# Patient Record
Sex: Female | Born: 1990 | Race: Black or African American | Hispanic: No | Marital: Single | Smoking: Never smoker
Health system: Southern US, Community
[De-identification: ages and names within clinical notes are randomized; demographics above are authoritative.]

---

## 2013-08-05 ENCOUNTER — Encounter (HOSPITAL_BASED_OUTPATIENT_CLINIC_OR_DEPARTMENT_OTHER): Payer: Self-pay | Admitting: Emergency Medicine

## 2013-08-05 ENCOUNTER — Emergency Department (HOSPITAL_BASED_OUTPATIENT_CLINIC_OR_DEPARTMENT_OTHER)
Admission: EM | Admit: 2013-08-05 | Discharge: 2013-08-05 | Disposition: A | Discharge status: Home / Self Care | Payer: Managed Care, Other (non HMO) | Attending: Emergency Medicine | Admitting: Emergency Medicine

## 2013-08-05 ENCOUNTER — Emergency Department (HOSPITAL_BASED_OUTPATIENT_CLINIC_OR_DEPARTMENT_OTHER): Payer: Managed Care, Other (non HMO)

## 2013-08-05 DIAGNOSIS — S058X9A Other injuries of unspecified eye and orbit, initial encounter: Secondary | ICD-10-CM | POA: Insufficient documentation

## 2013-08-05 DIAGNOSIS — W540XXA Bitten by dog, initial encounter: Secondary | ICD-10-CM | POA: Insufficient documentation

## 2013-08-05 DIAGNOSIS — S01119A Laceration without foreign body of unspecified eyelid and periocular area, initial encounter: Secondary | ICD-10-CM | POA: Insufficient documentation

## 2013-08-05 DIAGNOSIS — IMO0002 Reserved for concepts with insufficient information to code with codable children: Secondary | ICD-10-CM

## 2013-08-05 DIAGNOSIS — S51809A Unspecified open wound of unspecified forearm, initial encounter: Secondary | ICD-10-CM | POA: Insufficient documentation

## 2013-08-05 DIAGNOSIS — Z23 Encounter for immunization: Secondary | ICD-10-CM | POA: Insufficient documentation

## 2013-08-05 DIAGNOSIS — Y9389 Activity, other specified: Secondary | ICD-10-CM | POA: Insufficient documentation

## 2013-08-05 DIAGNOSIS — S0181XA Laceration without foreign body of other part of head, initial encounter: Secondary | ICD-10-CM

## 2013-08-05 DIAGNOSIS — S01409A Unspecified open wound of unspecified cheek and temporomandibular area, initial encounter: Secondary | ICD-10-CM | POA: Insufficient documentation

## 2013-08-05 DIAGNOSIS — Z79899 Other long term (current) drug therapy: Secondary | ICD-10-CM | POA: Insufficient documentation

## 2013-08-05 DIAGNOSIS — Y92009 Unspecified place in unspecified non-institutional (private) residence as the place of occurrence of the external cause: Secondary | ICD-10-CM | POA: Insufficient documentation

## 2013-08-05 DIAGNOSIS — T148XXA Other injury of unspecified body region, initial encounter: Secondary | ICD-10-CM

## 2013-08-05 DIAGNOSIS — S0530XA Ocular laceration without prolapse or loss of intraocular tissue, unspecified eye, initial encounter: Secondary | ICD-10-CM | POA: Insufficient documentation

## 2013-08-05 MED ORDER — LIDOCAINE-EPINEPHRINE-TETRACAINE (LET) SOLUTION
3.0000 mL | Freq: Once | NASAL | Status: AC
  Administered 2013-08-05: 3 mL via TOPICAL
  Filled 2013-08-05: qty 3

## 2013-08-05 MED ORDER — AMOXICILLIN-POT CLAVULANATE 875-125 MG PO TABS: 1.0000 | ORAL_TABLET | Freq: Two times a day (BID) | ORAL | Status: AC

## 2013-08-05 MED ORDER — OXYCODONE-ACETAMINOPHEN 5-325 MG PO TABS
1.0000 | ORAL_TABLET | Freq: Once | ORAL | Status: AC
  Administered 2013-08-05: 1 via ORAL
  Filled 2013-08-05: qty 1

## 2013-08-05 MED ORDER — ONDANSETRON 8 MG PO TBDP
8.0000 mg | ORAL_TABLET | Freq: Once | ORAL | Status: AC
  Administered 2013-08-05: 8 mg via ORAL
  Filled 2013-08-05: qty 1

## 2013-08-05 MED ORDER — OXYCODONE-ACETAMINOPHEN 5-325 MG PO TABS: 1.0000 | ORAL_TABLET | ORAL | Status: AC | PRN

## 2013-08-05 MED ORDER — TETANUS-DIPHTH-ACELL PERTUSSIS 5-2.5-18.5 LF-MCG/0.5 IM SUSP
0.5000 mL | Freq: Once | INTRAMUSCULAR | Status: AC
  Administered 2013-08-05: 0.5 mL via INTRAMUSCULAR
  Filled 2013-08-05: qty 0.5

## 2013-08-05 NOTE — ED Notes (Signed)
I was unable to get new set of vitals as PA. was completing multiple sutures.

## 2013-08-05 NOTE — ED Provider Notes (Signed)
Medical screening examination/treatment/procedure(s) were conducted as a shared visit with non-physician practitioner(s) and myself.  I personally evaluated the patient during the encounter.  Multiple dog bit his right forearm with concern of underlying muscle or tendon involvement causing weakness to the patient's hand; facial injuries closed however right forearm injuries will remain open after irrigation in ED and patient can follow up with hand surgery.   Hurman HornJohn M Azlin Zilberman, MD 08/07/13 1318

## 2013-08-05 NOTE — ED Provider Notes (Signed)
CSN: [id number]     Arrival date & time 08/05/13  1539 History   First MD Initiated Contact with Patient 08/05/13 1727     Chief Complaint  Patient presents with  . Animal Bite   23 yo AA F presents with injury sustained from a dog bite at 2:30. The dog belongs to here cousin, whom she was visiting. She is familiar with the dog and has petted it many times before. She said she went inside her cousin's home, petted the dog as usual and went upstairs. The dog followed her and bit her on her R face, unprovoked. She tried to move the dog and was subsequently bit on her R fore arm.. Police Report was filed, dog up to date on shots, Pts last tetanus was 2008. No known allergies, no medications other than depo.   (Consider location/radiation/quality/duration/timing/severity/associated sxs/prior Treatment) Patient is a 23 y.o. female presenting with animal bite.  Animal Bite Associated symptoms: numbness   Associated symptoms: no fever     History reviewed. No pertinent past medical history. History reviewed. No pertinent past surgical history. History reviewed. No pertinent family history. History  Substance Use Topics  . Smoking status: Never Smoker   . Smokeless tobacco: Not on file  . Alcohol Use: Yes     Comment: weekly   OB History   Grav Para Term Preterm Abortions TAB SAB Ect Mult Living                 Review of Systems  Constitutional: Negative for fever and chills.  HENT:       No pain with mandible movement, Pt feels jaw lines up appropriately as before  Eyes: Negative for pain and redness.       No pain with ocular movement  Skin: Positive for wound.       Wound to R face/cheek and R forearm  Neurological: Positive for weakness and numbness.       Numbness and weakness of R hand/wrist  Hematological: Does not bruise/bleed easily.      Allergies  Review of patient's allergies indicates no known allergies.  Home Medications   Prior to Admission medications     Medication Sig Start Date End Date Taking? Authorizing Provider  medroxyPROGESTERone (DEPO-PROVERA) 150 MG/ML injection Inject 150 mg into the muscle every 3 (three) months.   Yes Historical Provider, MD   BP 158/102  Pulse 77  Temp(Src) 98.4 F (36.9 C) (Oral)  Resp 18  Ht 5\' 9"  (1.753 m)  Wt 174 lb (78.926 kg)  BMI 25.68 kg/m2  SpO2 99% Physical Exam  Constitutional: She is oriented to person, place, and time.  HENT:  Head:    Cardiovascular:  Pulses:      Radial pulses are 2+ on the right side, and 2+ on the left side.  Hemostasis achieved for all wounds. R hand is cool to touch but is vascularly intact  Musculoskeletal:  ROM-motor deficits noted on R wrist flexion and extension. Upon passive ROM, pt reports pain at bite site.   Neurological: She is alert and oriented to person, place, and time.   Able to flex and extend 4th and 5th finger. Unable to flex or extend 2nd and 3rd. Can flex thumb, but not extendUnable to flex or extend fingers. Minimal active Pt states she can still sense touch on her fingers but lacks motor control.  Skin:  Location of dog bite is on proximal r fore arm and resulting lesions are in circumferential  pattern with roughly 4-5 lesions anterior and 3-4 lesions posterior.. Several lacerations and puncture wounds ranging from 0.5cm to 1.5cm all of approx 0.5 to 1cm in depth.     ED Course  Procedures (including critical care time) Labs Review Labs Reviewed - No data to display  Imaging Review Dg Forearm Right  08/05/2013   CLINICAL DATA:  Multiple right forearm lacerations following a dog bite.  EXAM: RIGHT FOREARM - 2 VIEW  COMPARISON:  None.  FINDINGS: Multiple small, rounded soft tissue defects in the proximal forearm with overlying bandage material. No fracture, dislocation or radiopaque foreign body.  IMPRESSION: Areas of soft tissue injury without fracture.   Electronically Signed   By: Gordan Payment M.D.   On: 08/05/2013 19:53     EKG  Interpretation None     Multiple puncture lacerations numbed with 2% lidocaine, heavy irrigation with debridement. No sutures placed, given mechanism of injury as dog bite.  MDM   Final diagnoses:  None   Diagnosis: Animal Bite #2 Facial Lacerations #3 Multiple puncture rooms, with possible tendon deficit on R forearm  Dog bite from well-known dog, known to have been vaccinated. No rabies course administered. Discussed possible tendon deficits with Dr. Mina Marble who advises splint and office f/u. Tetanus updated. Pain managed in ED. Pot placed on Augmentin and encouraged to have close f/u      Sharlene Motts, PA-C 08/05/13 2201

## 2013-08-05 NOTE — ED Provider Notes (Signed)
LACERATION REPAIR Performed by: Elpidio AnisUPSTILL, Dlynn Ranes A Authorized by: Elpidio AnisUPSTILL, Esma Kilts A Consent: Verbal consent obtained. Risks and benefits: risks, benefits and alternatives were discussed Consent given by: patient Patient identity confirmed: provided demographic data Prepped and Draped in normal sterile fashion Wound explored  Laceration Location: Right face, over zygoma  Laceration Length: 2 cm  No Foreign Bodies seen or palpated  Anesthesia: local infiltration  Local anesthetic: lidocaine 2% w/o epinephrine  Anesthetic total: 1 ml, L.E.T. Also used  Irrigation method: syringe Amount of cleaning: standard  Skin closure: 6-0 prolene  Number of sutures: 6  Technique: running  Patient tolerance: Patient tolerated the procedure well with no immediate complications.     #2 LACERATION REPAIR Performed by: Elpidio AnisUPSTILL, Jisella Ashenfelter A Authorized by: Elpidio AnisUPSTILL, Delainey Winstanley A Consent: Verbal consent obtained. Risks and benefits: risks, benefits and alternatives were discussed Consent given by: patient Patient identity confirmed: provided demographic data Prepped and Draped in normal sterile fashion Wound explored  Laceration Location: right perioral area  Laceration Length: stellate  No Foreign Bodies seen or palpated  Anesthesia: local infiltration  Local anesthetic: lidocaine 2% w/o epinephrine  Anesthetic total: 1 ml, LET used also  Irrigation method: syringe Amount of cleaning: standard  Skin closure: 6-0 prolene  Number of sutures: 6  Technique: simple interrupted  Patient tolerance: Patient tolerated the procedure well with no immediate complications.   #3 LACERATION REPAIR Performed by: Elpidio AnisUPSTILL, Asianae Minkler A Authorized by: Elpidio AnisUPSTILL, Kearstyn Avitia A Consent: Verbal consent obtained. Risks and benefits: risks, benefits and alternatives were discussed Consent given by: patient Patient identity confirmed: provided demographic data Prepped and Draped in normal sterile fashion Wound  explored  Laceration Location: below right eye  Laceration Length: less than 1 cm  No Foreign Bodies seen or palpated  Anesthesia: local infiltration  Local anesthetic: lidocaine 2% w/o epinephrine  Anesthetic total: less than 1  ml  Irrigation method: syringe Amount of cleaning: standard  Skin closure: 6-0 prolene  Number of sutures: 1  Technique: simple interruped  Patient tolerance: Patient tolerated the procedure well with no immediate complications.   #4 LACERATION REPAIR Performed by: Elpidio AnisUPSTILL, Joss Friedel A Authorized by: Elpidio AnisUPSTILL, Gerda Yin A Consent: Verbal consent obtained. Risks and benefits: risks, benefits and alternatives were discussed Consent given by: patient Patient identity confirmed: provided demographic data Prepped and Draped in normal sterile fashion Wound explored  Laceration Location: lateral right cheek  Laceration Length: less than 1 cm  No Foreign Bodies seen or palpated  Anesthesia: local infiltration  Local anesthetic: lidocaine 2% w/o epinephrine  Anesthetic total: 1 ml  Irrigation method: syringe Amount of cleaning: standard  Skin closure: simple interrupted  Number of sutures: 2  Technique: simple interruped  Patient tolerance: Patient tolerated the procedure well with no immediate complications.   Arnoldo HookerShari A Treyvonne Tata, PA-C 08/05/13 2154

## 2013-08-05 NOTE — ED Notes (Signed)
Pt's facial wounds now sutured and clean. Sugar tong cast in place.

## 2013-08-05 NOTE — ED Notes (Signed)
Patient was bitten by her friends dog this afternoon. Wounds on her face and right arm. Patient has a large wound on her arm, continuing to bleed, as well as puncture teeth wounds. She has three large areas on her right face, also with smaller puncture wounds.

## 2013-08-05 NOTE — Discharge Instructions (Signed)
Animal Bite °An animal bite can result in a scratch on the skin, deep open cut, puncture of the skin, crush injury, or tearing away of the skin or a body part. Dogs are responsible for most animal bites. Children are bitten more often than adults. An animal bite can range from very mild to more serious. A small bite from your house pet is no cause for alarm. However, some animal bites can become infected or injure a bone or other tissue. You must seek medical care if: °· The skin is broken and bleeding does not slow down or stop after 15 minutes. °· The puncture is deep and difficult to clean (such as a cat bite). °· Pain, warmth, redness, or pus develops around the wound. °· The bite is from a stray animal or rodent. There may be a risk of rabies infection. °· The bite is from a snake, raccoon, skunk, fox, coyote, or bat. There may be a risk of rabies infection. °· The person bitten has a chronic illness such as diabetes, liver disease, or cancer, or the person takes medicine that lowers the immune system. °· There is concern about the location and severity of the bite. °It is important to clean and protect an animal bite wound right away to prevent infection. Follow these steps: °· Clean the wound with plenty of water and soap. °· Apply an antibiotic cream. °· Apply gentle pressure over the wound with a clean towel or gauze to slow or stop bleeding. °· Elevate the affected area above the heart to help stop any bleeding. °· Seek medical care. Getting medical care within 8 hours of the animal bite leads to the best possible outcome. °DIAGNOSIS  °Your caregiver will most likely: °· Take a detailed history of the animal and the bite injury. °· Perform a wound exam. °· Take your medical history. °Blood tests or X-rays may be performed. Sometimes, infected bite wounds are cultured and sent to a lab to identify the infectious bacteria.  °TREATMENT  °Medical treatment will depend on the location and type of animal bite as  well as the patient's medical history. Treatment may include: °· Wound care, such as cleaning and flushing the wound with saline solution, bandaging, and elevating the affected area. °· Antibiotics. °· Tetanus immunization. °· Rabies immunization. °· Leaving the wound open to heal. This is often done with animal bites, due to the high risk of infection. However, in certain cases, wound closure with stitches, wound adhesive, skin adhesive strips, or staples may be used. ° Infected bites that are left untreated may require intravenous (IV) antibiotics and surgical treatment in the hospital. °HOME CARE INSTRUCTIONS °· Follow your caregiver's instructions for wound care. °· Take all medicines as directed. °· If your caregiver prescribes antibiotics, take them as directed. Finish them even if you start to feel better. °· Follow up with your caregiver for further exams or immunizations as directed. °You may need a tetanus shot if: °· You cannot remember when you had your last tetanus shot. °· You have never had a tetanus shot. °· The injury broke your skin. °If you get a tetanus shot, your arm may swell, get red, and feel warm to the touch. This is common and not a problem. If you need a tetanus shot and you choose not to have one, there is a rare chance of getting tetanus. Sickness from tetanus can be serious. °SEEK MEDICAL CARE IF: °· You notice warmth, redness, soreness, swelling, pus discharge, or a bad   smell coming from the wound.  You have a red line on the skin coming from the wound.  You have a fever, chills, or a general ill feeling.  You have nausea or vomiting.  You have continued or worsening pain.  You have trouble moving the injured part.  You have other questions or concerns. MAKE SURE YOU:  Understand these instructions.  Will watch your condition.  Will get help right away if you are not doing well or get worse. Document Released: 09/09/2010 Document Revised: 03/16/2011 Document  Reviewed: 09/09/2010 [facility] Patient Information 2015 [place], [place]. This information is not intended to replace advice given to you by your health care provider. Make sure you discuss any questions you have with your health care provider. Wound Care Wound care helps prevent pain and infection.  You may need a tetanus shot if:  You cannot remember when you had your last tetanus shot.  You have never had a tetanus shot.  The injury broke your skin. If you need a tetanus shot and you choose not to have one, you may get tetanus. Sickness from tetanus can be serious. HOME CARE   Only take medicine as told by your doctor.  Clean the wound daily with mild soap and water.  Change any bandages (dressings) as told by your doctor.  Put medicated cream and a bandage on the wound as told by your doctor.  Change the bandage if it gets wet, dirty, or starts to smell.  Take showers. Do not take baths, swim, or do anything that puts your wound under water.  Rest and raise (elevate) the wound until the pain and puffiness (swelling) are better.  Keep all doctor visits as told. GET HELP RIGHT AWAY IF:   Yellowish-white fluid (pus) comes from the wound.  Medicine does not lessen your pain.  There is a red streak going away from the wound.  You have a fever. MAKE SURE YOU:   Understand these instructions.  Will watch your condition.  Will get help right away if you are not doing well or get worse. Document Released: 10/01/2007 Document Revised: 03/16/2011 Document Reviewed: 04/27/2010 [facility] Patient Information 2015 [place], [place]. This information is not intended to replace advice given to you by your health care provider. Make sure you discuss any questions you have with your health care provider. Sutured Wound Care Sutures are stitches that can be used to close wounds. Wound care helps prevent pain and infection.  HOME CARE INSTRUCTIONS   Rest and elevate the injured area until  all the pain and swelling are gone.  Only take over-the-counter or prescription medicines for pain, discomfort, or fever as directed by your caregiver.  After 48 hours, gently wash the area with mild soap and water once a day, or as directed. Rinse off the soap. Pat the area dry with a clean towel. Do not rub the wound. This may cause bleeding.  Follow your caregiver's instructions for how often to change the bandage (dressing). Stop using a dressing after 2 days or after the wound stops draining.  If the dressing sticks, moisten it with soapy water and gently remove it.  Apply ointment on the wound as directed.  Avoid stretching a sutured wound.  Drink enough fluids to keep your urine clear or pale yellow.  Follow up with your caregiver for suture removal as directed.  Use sunscreen on your wound for the next 3 to 6 months so the scar will not darken. SEEK IMMEDIATE MEDICAL CARE  IF:   Your wound becomes red, swollen, hot, or tender.  You have increasing pain in the wound.  You have a red streak that extends from the wound.  There is pus coming from the wound.  You have a fever.  You have shaking chills.  There is a bad smell coming from the wound.  You have persistent bleeding from the wound. MAKE SURE YOU:   Understand these instructions.  Will watch your condition.  Will get help right away if you are not doing well or get worse. Document Released: 01/30/2004 Document Revised: 03/16/2011 Document Reviewed: 04/27/2010 [facility] Patient Information 2015 [place]. This information is not intended to replace advice given to you by your health care provider. Make sure you discuss any questions you have with your health care provider.

## 2013-08-07 NOTE — ED Provider Notes (Signed)
Medical screening examination/treatment/procedure(s) were performed by non-physician practitioner and as supervising physician I was immediately available for consultation/collaboration.   EKG Interpretation None       Hurman HornJohn M Rita Prom, MD 08/07/13 1319

## 2013-08-09 ENCOUNTER — Encounter (HOSPITAL_BASED_OUTPATIENT_CLINIC_OR_DEPARTMENT_OTHER): Payer: Self-pay | Admitting: Emergency Medicine

## 2013-08-09 ENCOUNTER — Emergency Department (HOSPITAL_BASED_OUTPATIENT_CLINIC_OR_DEPARTMENT_OTHER)
Admission: EM | Admit: 2013-08-09 | Discharge: 2013-08-09 | Disposition: A | Discharge status: Home / Self Care | Payer: Managed Care, Other (non HMO) | Attending: Emergency Medicine | Admitting: Emergency Medicine

## 2013-08-09 DIAGNOSIS — S0180XA Unspecified open wound of other part of head, initial encounter: Secondary | ICD-10-CM | POA: Insufficient documentation

## 2013-08-09 DIAGNOSIS — Z87828 Personal history of other (healed) physical injury and trauma: Secondary | ICD-10-CM | POA: Insufficient documentation

## 2013-08-09 DIAGNOSIS — Z4802 Encounter for removal of sutures: Secondary | ICD-10-CM

## 2013-08-09 DIAGNOSIS — Z79899 Other long term (current) drug therapy: Secondary | ICD-10-CM | POA: Insufficient documentation

## 2013-08-09 DIAGNOSIS — T798XXA Other early complications of trauma, initial encounter: Secondary | ICD-10-CM

## 2013-08-09 DIAGNOSIS — Y849 Medical procedure, unspecified as the cause of abnormal reaction of the patient, or of later complication, without mention of misadventure at the time of the procedure: Secondary | ICD-10-CM | POA: Insufficient documentation

## 2013-08-09 NOTE — Discharge Instructions (Signed)
Please read and follow all provided instructions.  Your diagnoses today include:  1. Visit for suture removal   2. Wound infection, initial encounter     Tests performed today include:  Vital signs. See below for your results today.   Medications prescribed:   None  Take any prescribed medications only as directed.   Home care instructions:  Follow any educational materials contained in this packet. Use warm compresses 3 times a day on the wound on your face.  Keep affected area above the level of your heart when possible. Wash area gently twice a day with warm soapy water. Do not apply alcohol or hydrogen peroxide. Cover the area if it draining or weeping.   Follow-up instructions: Return to the Emergency Department in 48-72 hours for a recheck.  Please follow-up with your primary care provider in the next 1 week for further evaluation of your symptoms.   Return instructions:  Return to the Emergency Department if you have:  Fever  Worsening symptoms  Worsening pain  Worsening swelling  Redness of the skin that moves away from the affected area, especially if it streaks away from the affected area   Any other emergent concerns  Your vital signs today were: BP 128/82   Pulse 72   Temp(Src) 99.4 F (37.4 C) (Oral)   Resp 18   Ht 5\' 9"  (1.753 m)   Wt 174 lb (78.926 kg)   BMI 25.68 kg/m2   SpO2 100% If your blood pressure (BP) was elevated above 135/85 this visit, please have this repeated by your doctor within one month. --------------

## 2013-08-09 NOTE — ED Notes (Signed)
For suture removal-placed on 8/1

## 2013-08-10 NOTE — ED Provider Notes (Signed)
CSN: [id number]     Arrival date & time 08/09/13  1848 History   First MD Initiated Contact with Patient 08/09/13 2000     Chief Complaint  Patient presents with  . Suture / Staple Removal     (Consider location/radiation/quality/duration/timing/severity/associated sxs/prior Treatment) HPI Comments: Patient presents for suture removal. Patient was bitten by dog 8/1. She had R arm wounds, followed by Dr. Mina MarbleWeingold. She had multiple facial wounds closed. Patient has noted small amount of pus from wound over R mandible. Otherwise, wounds healing well. No fever, N/V. Pain is controlled. Patient is compliant with Augmentin. The onset of this condition was acute. The course is constant. Aggravating factors: none. Alleviating factors: none.    Patient is a 23 y.o. female presenting with suture removal. The history is provided by the patient and medical records.  Suture / Staple Removal Pertinent negatives include no fever, nausea or vomiting.    History reviewed. No pertinent past medical history. History reviewed. No pertinent past surgical history. No family history on file. History  Substance Use Topics  . Smoking status: Never Smoker   . Smokeless tobacco: Not on file  . Alcohol Use: Yes     Comment: weekly   OB History   Grav Para Term Preterm Abortions TAB SAB Ect Mult Living                 Review of Systems  Constitutional: Negative for fever.  Gastrointestinal: Negative for nausea and vomiting.  Skin: Positive for color change and wound.  Hematological: Negative for adenopathy.      Allergies  Review of patient's allergies indicates no known allergies.  Home Medications   Prior to Admission medications   Medication Sig Start Date End Date Taking? Authorizing Provider  amoxicillin-clavulanate (AUGMENTIN) 875-125 MG per tablet Take 1 tablet by mouth every 12 (twelve) hours. 08/05/13   Sharlene MottsBenjamin W Cartner, PA-C  medroxyPROGESTERone (DEPO-PROVERA) 150 MG/ML injection Inject  150 mg into the muscle every 3 (three) months.    Historical Provider, MD  oxyCODONE-acetaminophen (PERCOCET/ROXICET) 5-325 MG per tablet Take 1-2 tablets by mouth every 4 (four) hours as needed for moderate pain or severe pain. 08/05/13   Earle GellBenjamin W Cartner, PA-C   BP 123/75  Pulse 67  Temp(Src) 99.4 F (37.4 C) (Oral)  Resp 16  Ht 5\' 9"  (1.753 m)  Wt 174 lb (78.926 kg)  BMI 25.68 kg/m2  SpO2 99%  Physical Exam  Nursing note and vitals reviewed. Constitutional: She appears well-developed and well-nourished.  HENT:  Head: Normocephalic and atraumatic.  Eyes: Conjunctivae are normal.  Neck: Normal range of motion. Neck supple.  Pulmonary/Chest: No respiratory distress.  Neurological: She is alert.  Skin: Skin is warm and dry.  Patient with 4 separate wound closures noted. Superior 3 are healing well. T-shaped wound over R mandible with significant crusting. When this was removed, a small amount of yellow pus was expressed from the wound with mild pressure. No significant surrounding cellulitis or warmth.   Psychiatric: She has a normal mood and affect.    ED Course  Procedures (including critical care time) Labs Review Labs Reviewed - No data to display  Imaging Review No results found.   EKG Interpretation None      Patient seen and examined.   Vital signs reviewed and are as follows: BP 123/75  Pulse 67  Temp(Src) 99.4 F (37.4 C) (Oral)  Resp 16  Ht 5\' 9"  (1.753 m)  Wt 174 lb (78.926 kg)  BMI 25.68 kg/m2  SpO2 99%  SUTURE REMOVAL Performed by: Carolee Rota  Consent: Verbal consent obtained. Patient identity confirmed: provided demographic data Time out: Immediately prior to procedure a "time out" was called to verify the correct patient, procedure, equipment, support staff and site/side marked as required.  Location details: below right eye  Wound Appearance: clean  Sutures/Staples Removed: 1  Facility: sutures placed in this facility Patient  tolerance: Patient tolerated the procedure well with no immediate complications.  SUTURE REMOVAL Performed by: Carolee Rota  Consent: Verbal consent obtained. Patient identity confirmed: provided demographic data Time out: Immediately prior to procedure a "time out" was called to verify the correct patient, procedure, equipment, support staff and site/side marked as required.  Location details: R zygoma  Wound Appearance: clean  Sutures/Staples Removed: 6, running  Facility: sutures placed in this facility Patient tolerance: Patient tolerated the procedure well with no immediate complications.  SUTURE REMOVAL Performed by: Carolee Rota  Consent: Verbal consent obtained. Patient identity confirmed: provided demographic data Time out: Immediately prior to procedure a "time out" was called to verify the correct patient, procedure, equipment, support staff and site/side marked as required.  Location details: lateral R cheek  Wound Appearance: clean  Sutures/Staples Removed: 2  Facility: sutures placed in this facility Patient tolerance: Patient tolerated the procedure well with no immediate complications.  SUTURE REMOVAL Performed by: Carolee Rota  Consent: Verbal consent obtained. Patient identity confirmed: provided demographic data Time out: Immediately prior to procedure a "time out" was called to verify the correct patient, procedure, equipment, support staff and site/side marked as required.  Location details: R perioral area, over R mandible  Wound Appearance: minor infection  Sutures/Staples Removed: 2  Facility: sutures placed in this facility Patient tolerance: Patient tolerated the procedure well with no immediate complications.  Note: Wound is not completely healed due to complicating infection. I left remaining sutures in place. The removal of these 2 sutures allowed wound to gape mildly which I feel will help drain purulent material. Patient agrees  to return in 24-72 hrs for recheck and removal of remaining sutures.   Pt counseled on wound care -- and need to perform warm compresses 3x daily to help infected wound drain.   Pt urged to return with worsening pain, worsening swelling, expanding area of redness or streaking up extremity, fever, or any other concerns. Urged to take complete course of antibiotics as prescribed. Counseled to take pain medications as prescribed. Pt verbalizes understanding and agrees with plan.   MDM   Final diagnoses:  Visit for suture removal  Wound infection, initial encounter   Overall -- facial wounds healing. Minor infection as noted. Continue abx, sutures removed as discussed. Partial removal of sutures of infected wound will hopefully help infection drain without compromising cosmetic outcome very much. Do not feel that labs/IV abx/specialist involvement indicated at this time as infection is very mild and limited.   Recheck: 48-72 hrs with return sooner if worsening.      Renne Crigler, PA-C 08/10/13 1426

## 2013-08-10 NOTE — ED Provider Notes (Signed)
Medical screening examination/treatment/procedure(s) were performed by non-physician practitioner and as supervising physician I was immediately available for consultation/collaboration.   EKG Interpretation None        Hanaan Gancarz, MD 08/10/13 1935 

## 2013-08-12 ENCOUNTER — Emergency Department (HOSPITAL_BASED_OUTPATIENT_CLINIC_OR_DEPARTMENT_OTHER)
Admission: EM | Admit: 2013-08-12 | Discharge: 2013-08-12 | Disposition: A | Discharge status: Home / Self Care | Payer: Managed Care, Other (non HMO) | Attending: Emergency Medicine | Admitting: Emergency Medicine

## 2013-08-12 ENCOUNTER — Encounter (HOSPITAL_BASED_OUTPATIENT_CLINIC_OR_DEPARTMENT_OTHER): Payer: Self-pay | Admitting: Emergency Medicine

## 2013-08-12 DIAGNOSIS — S0181XD Laceration without foreign body of other part of head, subsequent encounter: Secondary | ICD-10-CM

## 2013-08-12 DIAGNOSIS — Z4801 Encounter for change or removal of surgical wound dressing: Secondary | ICD-10-CM | POA: Insufficient documentation

## 2013-08-12 DIAGNOSIS — Z792 Long term (current) use of antibiotics: Secondary | ICD-10-CM | POA: Insufficient documentation

## 2013-08-12 DIAGNOSIS — Z09 Encounter for follow-up examination after completed treatment for conditions other than malignant neoplasm: Secondary | ICD-10-CM | POA: Insufficient documentation

## 2013-08-12 NOTE — Discharge Instructions (Signed)
Facial Laceration  A facial laceration is a cut on the face. These injuries can be painful and cause bleeding. Lacerations usually heal quickly, but they need special care to reduce scarring. DIAGNOSIS  Your health care provider will take a medical history, ask for details about how the injury occurred, and examine the wound to determine how deep the cut is. TREATMENT  Some facial lacerations may not require closure. Others may not be able to be closed because of an increased risk of infection. The risk of infection and the chance for successful closure will depend on various factors, including the amount of time since the injury occurred. The wound may be cleaned to help prevent infection. If closure is appropriate, pain medicines may be given if needed. Your health care provider will use stitches (sutures), wound glue (adhesive), or skin adhesive strips to repair the laceration. These tools bring the skin edges together to allow for faster healing and a better cosmetic outcome. If needed, you may also be given a tetanus shot. HOME CARE INSTRUCTIONS  Only take over-the-counter or prescription medicines as directed by your health care provider.  Follow your health care provider's instructions for wound care. These instructions will vary depending on the technique used for closing the wound. For Sutures:  Keep the wound clean and dry.   If you were given a bandage (dressing), you should change it at least once a day. Also change the dressing if it becomes wet or dirty, or as directed by your health care provider.   Wash the wound with soap and water 2 times a day. Rinse the wound off with water to remove all soap. Pat the wound dry with a clean towel.   After cleaning, apply a thin layer of the antibiotic ointment recommended by your health care provider. This will help prevent infection and keep the dressing from sticking.   You may shower as usual after the first 24 hours. Do not soak the  wound in water until the sutures are removed.   Get your sutures removed as directed by your health care provider. With facial lacerations, sutures should usually be taken out after 4-5 days to avoid stitch marks.   Wait a few days after your sutures are removed before applying any makeup. For Skin Adhesive Strips:  Keep the wound clean and dry.   Do not get the skin adhesive strips wet. You may bathe carefully, using caution to keep the wound dry.   If the wound gets wet, pat it dry with a clean towel.   Skin adhesive strips will fall off on their own. You may trim the strips as the wound heals. Do not remove skin adhesive strips that are still stuck to the wound. They will fall off in time.  For Wound Adhesive:  You may briefly wet your wound in the shower or bath. Do not soak or scrub the wound. Do not swim. Avoid periods of heavy sweating until the skin adhesive has fallen off on its own. After showering or bathing, gently pat the wound dry with a clean towel.   Do not apply liquid medicine, cream medicine, ointment medicine, or makeup to your wound while the skin adhesive is in place. This may loosen the film before your wound is healed.   If a dressing is placed over the wound, be careful not to apply tape directly over the skin adhesive. This may cause the adhesive to be pulled off before the wound is healed.   Avoid   prolonged exposure to sunlight or tanning lamps while the skin adhesive is in place.  The skin adhesive will usually remain in place for 5-10 days, then naturally fall off the skin. Do not pick at the adhesive film.  After Healing: Once the wound has healed, cover the wound with sunscreen during the day for 1 full year. This can help minimize scarring. Exposure to ultraviolet light in the first year will darken the scar. It can take 1-2 years for the scar to lose its redness and to heal completely.  SEEK IMMEDIATE MEDICAL CARE IF:  You have redness, pain, or  swelling around the wound.   You see ayellowish-white fluid (pus) coming from the wound.   You have chills or a fever.  MAKE SURE YOU:  Understand these instructions.  Will watch your condition.  Will get help right away if you are not doing well or get worse. Document Released: 01/30/2004 Document Revised: 10/12/2012 Document Reviewed: 08/04/2012 [employer] Patient Information 2015 [employer]. This information is not intended to replace advice given to you by your health care provider. Make sure you discuss any questions you have with your health care provider.  

## 2013-08-12 NOTE — ED Provider Notes (Signed)
CSN: [id number]     Arrival date & time 08/12/13  1643 History   First MD Initiated Contact with Patient 08/12/13 1747     Chief Complaint  Patient presents with  . Suture / Staple Removal   Patient is a 23 y.o. female presenting with suture removal.  Suture / Staple Removal    Patient is a 23 y.o. Female who returns to the ED for suture removal.  Patient was attacked by a dog on 08/05/13 and had several facial lacerations which were sutured at this time.  Patient was seen here on 08/10/13 and had two sutures left in at that time due to mild infection and purulent drainage from one of her facial lacerations. Patient states that the drainage stopped approximately two days ago and she has seen a reduction of her facial swelling at this time.  Patient states that she has two days left of her Augmentin which she has been taking as prescribed.  She denies fever, chills, nausea, vomiting, neck swelling, shortness of breath.   History reviewed. No pertinent past medical history. History reviewed. No pertinent past surgical history. No family history on file. History  Substance Use Topics  . Smoking status: Never Smoker   . Smokeless tobacco: Not on file  . Alcohol Use: Yes     Comment: weekly   OB History   Grav Para Term Preterm Abortions TAB SAB Ect Mult Living                 Review of Systems See HPI   Allergies  Review of patient's allergies indicates no known allergies.  Home Medications   Prior to Admission medications   Medication Sig Start Date End Date Taking? Authorizing Provider  amoxicillin-clavulanate (AUGMENTIN) 875-125 MG per tablet Take 1 tablet by mouth every 12 (twelve) hours. 08/05/13   Sharlene MottsBenjamin W Cartner, PA-C  medroxyPROGESTERone (DEPO-PROVERA) 150 MG/ML injection Inject 150 mg into the muscle every 3 (three) months.    Historical Provider, MD  oxyCODONE-acetaminophen (PERCOCET/ROXICET) 5-325 MG per tablet Take 1-2 tablets by mouth every 4 (four) hours as needed for  moderate pain or severe pain. 08/05/13   Sharlene MottsBenjamin W Cartner, PA-C   There were no vitals taken for this visit. Physical Exam  Nursing note and vitals reviewed. Constitutional: She is oriented to person, place, and time. She appears well-developed and well-nourished. No distress.  HENT:  Head: Normocephalic and atraumatic.  Mouth/Throat: No oropharyngeal exudate.  Eyes: Conjunctivae are normal. Pupils are equal, round, and reactive to light. No scleral icterus.  Neck: Normal range of motion. Neck supple. No JVD present. No thyromegaly present.  Cardiovascular: Normal rate, regular rhythm, normal heart sounds and intact distal pulses.  Exam reveals no gallop and no friction rub.   No murmur heard. Pulmonary/Chest: Effort normal and breath sounds normal. No respiratory distress. She has no wheezes. She has no rales. She exhibits no tenderness.  Abdominal: Soft. Bowel sounds are normal.  Musculoskeletal: Normal range of motion.  Lymphadenopathy:    She has no cervical adenopathy.  Neurological: She is alert and oriented to person, place, and time.  Skin: Skin is warm and dry. She is not diaphoretic.  Scattered facial lacerations which appear to be healing.  There is no evidence of surrounding erythema, cellulitis, or active drainage at this time.  There is one facial laceration to the right lower cheek which still has one blue prolene suture.  There is firmness to palpation below the sutured laceration, with no tenderness  to palpation or fluctuance at this time.    Psychiatric: She has a normal mood and affect. Her behavior is normal. Judgment and thought content normal.    ED Course  SUTURE REMOVAL Date/Time: 08/12/2013 6:11 PM Performed by: Terri Piedra A Authorized by: Terri Piedra A Consent: Verbal consent obtained. Risks and benefits: risks, benefits and alternatives were discussed Consent given by: patient Patient understanding: patient states understanding of the procedure  being performed Site marked: the operative site was marked Imaging studies: imaging studies available Patient identity confirmed: verbally with patient Time out: Immediately prior to procedure a "time out" was called to verify the correct patient, procedure, equipment, support staff and site/side marked as required. Body area: head/neck Location details: right cheek Wound Appearance: clean Sutures Removed: 1 Facility: sutures placed in this facility Patient tolerance: Patient tolerated the procedure well with no immediate complications.   (including critical care time) Labs Review Labs Reviewed - No data to display  Imaging Review No results found.   EKG Interpretation None      MDM   Final diagnoses:  Facial laceration, subsequent encounter   Patient is a 23 y.o. Female who presents to the ED for suture removal. Patient is still currently taking Augmentin at this time. There does not appear to be any active drainage or surrounding cellulitis of the cheek laceration which appeared to be mildly infected on 8/6.  Dr. Micheline Maze has also looked at the laceration and feels that this does not require further antibiotics at this time.  Suture was removed as seen above.  Patient was told to finish her antibiotics and to return for worsening swelling, drainage, fever, nausea, vomiting, or chills.  She states understanding at this time.  Patient is stable for discharge.    Eben Burow, PA-C 08/12/13 1816

## 2013-08-12 NOTE — ED Notes (Signed)
Patient has stitches in her right arm that she needs removed.

## 2013-08-12 NOTE — ED Provider Notes (Signed)
Medical screening examination/treatment/procedure(s) were performed by non-physician practitioner and as supervising physician I was immediately available for consultation/collaboration.   EKG Interpretation None        Toy CookeyMegan Marabella Popiel, MD 08/12/13 2038

## 2015-07-02 IMAGING — CR DG FOREARM 2V*R*
2 series · 2 of 2 positions shown · non-contrast
Comparison: None.

CLINICAL DATA: Multiple right forearm lacerations following a dog
bite.

EXAM:
RIGHT FOREARM - 2 VIEW

[x forearm ap right]
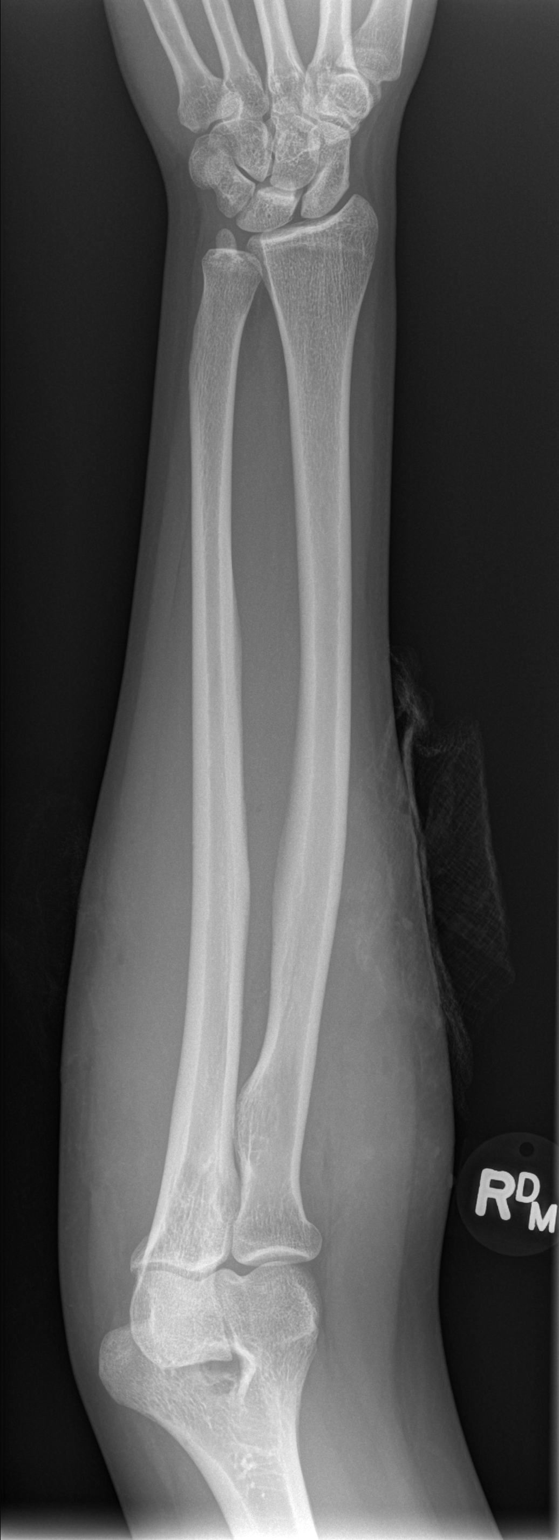

[x forearm lat right]
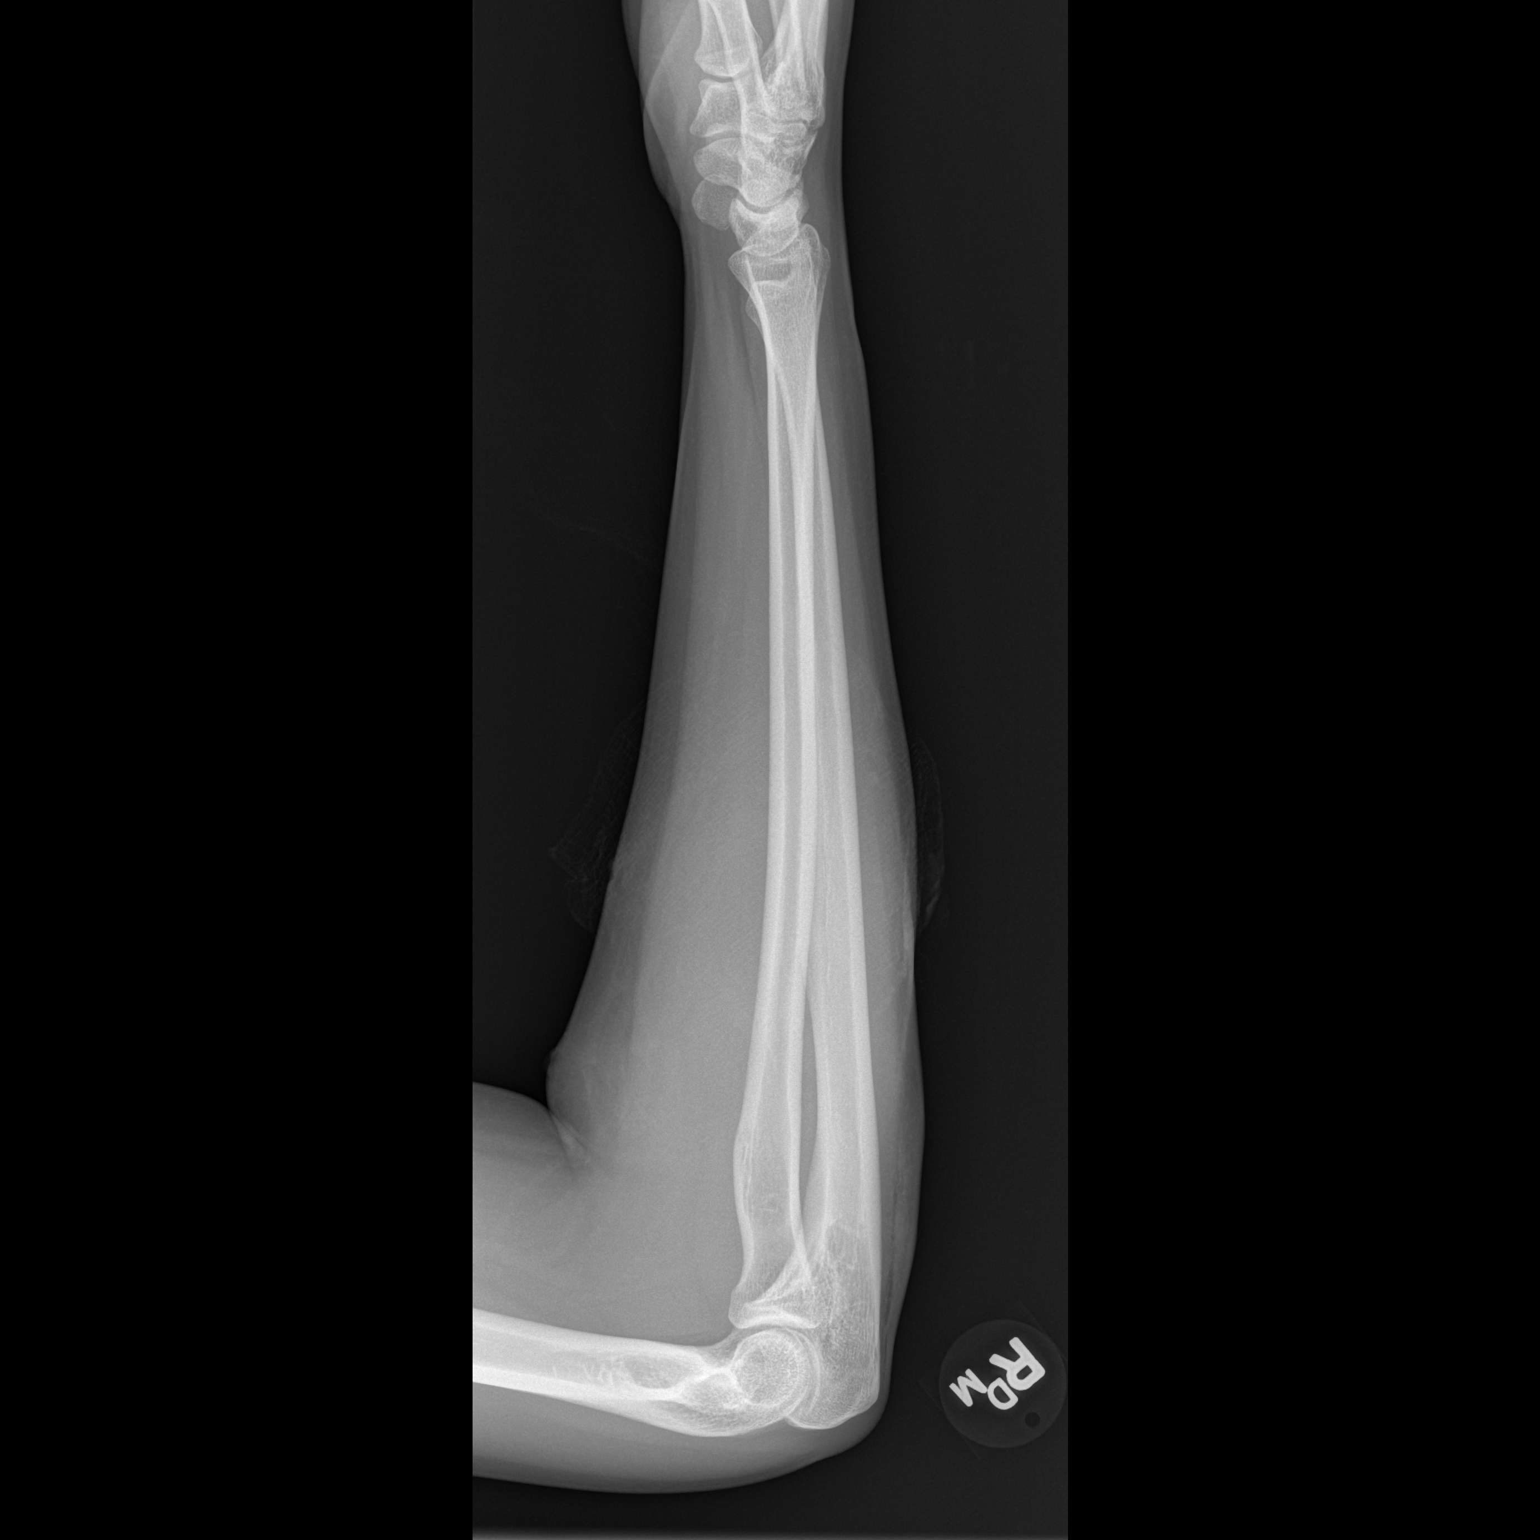

[2 of 2 positions shown; findings below may reference images not displayed]

FINDINGS: Multiple small, rounded soft tissue defects in the proximal forearm
with overlying bandage material. No fracture, dislocation or
radiopaque foreign body.
IMPRESSION: Areas of soft tissue injury without fracture.
